# Patient Record
Sex: Male | Born: 1940 | Race: White | Hispanic: No | Marital: Married | Smoking: Never smoker
Health system: Southern US, Community
[De-identification: ages and names within clinical notes are randomized; demographics above are authoritative.]

## PROBLEM LIST (undated history)

## (undated) DIAGNOSIS — E119 Type 2 diabetes mellitus without complications: Secondary | ICD-10-CM

---

## 2014-05-07 DIAGNOSIS — Z79899 Other long term (current) drug therapy: Secondary | ICD-10-CM | POA: Diagnosis not present

## 2014-05-07 DIAGNOSIS — Z1211 Encounter for screening for malignant neoplasm of colon: Secondary | ICD-10-CM | POA: Diagnosis not present

## 2014-05-07 DIAGNOSIS — E780 Pure hypercholesterolemia: Secondary | ICD-10-CM | POA: Diagnosis not present

## 2014-05-07 DIAGNOSIS — Z0000 Encounter for general adult medical examination without abnormal findings: Secondary | ICD-10-CM | POA: Diagnosis not present

## 2014-05-07 DIAGNOSIS — H9191 Unspecified hearing loss, right ear: Secondary | ICD-10-CM | POA: Diagnosis not present

## 2014-05-07 DIAGNOSIS — E119 Type 2 diabetes mellitus without complications: Secondary | ICD-10-CM | POA: Diagnosis not present

## 2014-05-07 DIAGNOSIS — D696 Thrombocytopenia, unspecified: Secondary | ICD-10-CM | POA: Diagnosis not present

## 2014-09-11 DIAGNOSIS — G479 Sleep disorder, unspecified: Secondary | ICD-10-CM | POA: Diagnosis not present

## 2014-09-11 DIAGNOSIS — Z23 Encounter for immunization: Secondary | ICD-10-CM | POA: Diagnosis not present

## 2014-09-11 DIAGNOSIS — E780 Pure hypercholesterolemia: Secondary | ICD-10-CM | POA: Diagnosis not present

## 2014-09-11 DIAGNOSIS — D696 Thrombocytopenia, unspecified: Secondary | ICD-10-CM | POA: Diagnosis not present

## 2014-09-11 DIAGNOSIS — E119 Type 2 diabetes mellitus without complications: Secondary | ICD-10-CM | POA: Diagnosis not present

## 2015-01-09 DIAGNOSIS — Z23 Encounter for immunization: Secondary | ICD-10-CM | POA: Diagnosis not present

## 2015-01-09 DIAGNOSIS — E780 Pure hypercholesterolemia: Secondary | ICD-10-CM | POA: Diagnosis not present

## 2015-01-09 DIAGNOSIS — G479 Sleep disorder, unspecified: Secondary | ICD-10-CM | POA: Diagnosis not present

## 2015-01-09 DIAGNOSIS — H269 Unspecified cataract: Secondary | ICD-10-CM | POA: Diagnosis not present

## 2015-01-09 DIAGNOSIS — D696 Thrombocytopenia, unspecified: Secondary | ICD-10-CM | POA: Diagnosis not present

## 2015-01-09 DIAGNOSIS — E119 Type 2 diabetes mellitus without complications: Secondary | ICD-10-CM | POA: Diagnosis not present

## 2015-05-09 DIAGNOSIS — H6980 Other specified disorders of Eustachian tube, unspecified ear: Secondary | ICD-10-CM | POA: Diagnosis not present

## 2015-05-09 DIAGNOSIS — G5600 Carpal tunnel syndrome, unspecified upper limb: Secondary | ICD-10-CM | POA: Diagnosis not present

## 2015-05-09 DIAGNOSIS — E7800 Pure hypercholesterolemia, unspecified: Secondary | ICD-10-CM | POA: Diagnosis not present

## 2015-05-09 DIAGNOSIS — R51 Headache: Secondary | ICD-10-CM | POA: Diagnosis not present

## 2015-05-09 DIAGNOSIS — D696 Thrombocytopenia, unspecified: Secondary | ICD-10-CM | POA: Diagnosis not present

## 2015-05-09 DIAGNOSIS — Z125 Encounter for screening for malignant neoplasm of prostate: Secondary | ICD-10-CM | POA: Diagnosis not present

## 2015-05-09 DIAGNOSIS — Z1211 Encounter for screening for malignant neoplasm of colon: Secondary | ICD-10-CM | POA: Diagnosis not present

## 2015-05-09 DIAGNOSIS — Z0000 Encounter for general adult medical examination without abnormal findings: Secondary | ICD-10-CM | POA: Diagnosis not present

## 2015-05-09 DIAGNOSIS — H269 Unspecified cataract: Secondary | ICD-10-CM | POA: Diagnosis not present

## 2015-05-09 DIAGNOSIS — E119 Type 2 diabetes mellitus without complications: Secondary | ICD-10-CM | POA: Diagnosis not present

## 2015-09-11 DIAGNOSIS — Z862 Personal history of diseases of the blood and blood-forming organs and certain disorders involving the immune mechanism: Secondary | ICD-10-CM | POA: Diagnosis not present

## 2015-09-11 DIAGNOSIS — E119 Type 2 diabetes mellitus without complications: Secondary | ICD-10-CM | POA: Diagnosis not present

## 2015-09-11 DIAGNOSIS — E7800 Pure hypercholesterolemia, unspecified: Secondary | ICD-10-CM | POA: Diagnosis not present

## 2015-09-11 DIAGNOSIS — H269 Unspecified cataract: Secondary | ICD-10-CM | POA: Diagnosis not present

## 2015-09-16 DIAGNOSIS — H2513 Age-related nuclear cataract, bilateral: Secondary | ICD-10-CM | POA: Diagnosis not present

## 2015-09-16 DIAGNOSIS — H5203 Hypermetropia, bilateral: Secondary | ICD-10-CM | POA: Diagnosis not present

## 2015-09-16 DIAGNOSIS — E119 Type 2 diabetes mellitus without complications: Secondary | ICD-10-CM | POA: Diagnosis not present

## 2015-09-16 DIAGNOSIS — Z135 Encounter for screening for eye and ear disorders: Secondary | ICD-10-CM | POA: Diagnosis not present

## 2016-01-14 DIAGNOSIS — H269 Unspecified cataract: Secondary | ICD-10-CM | POA: Diagnosis not present

## 2016-01-14 DIAGNOSIS — Z23 Encounter for immunization: Secondary | ICD-10-CM | POA: Diagnosis not present

## 2016-01-14 DIAGNOSIS — E119 Type 2 diabetes mellitus without complications: Secondary | ICD-10-CM | POA: Diagnosis not present

## 2016-01-14 DIAGNOSIS — Z862 Personal history of diseases of the blood and blood-forming organs and certain disorders involving the immune mechanism: Secondary | ICD-10-CM | POA: Diagnosis not present

## 2016-01-14 DIAGNOSIS — E7800 Pure hypercholesterolemia, unspecified: Secondary | ICD-10-CM | POA: Diagnosis not present

## 2016-05-26 DIAGNOSIS — Z862 Personal history of diseases of the blood and blood-forming organs and certain disorders involving the immune mechanism: Secondary | ICD-10-CM | POA: Diagnosis not present

## 2016-05-26 DIAGNOSIS — E119 Type 2 diabetes mellitus without complications: Secondary | ICD-10-CM | POA: Diagnosis not present

## 2016-05-26 DIAGNOSIS — R221 Localized swelling, mass and lump, neck: Secondary | ICD-10-CM | POA: Diagnosis not present

## 2016-05-26 DIAGNOSIS — E7800 Pure hypercholesterolemia, unspecified: Secondary | ICD-10-CM | POA: Diagnosis not present

## 2016-05-26 DIAGNOSIS — Z125 Encounter for screening for malignant neoplasm of prostate: Secondary | ICD-10-CM | POA: Diagnosis not present

## 2016-05-26 DIAGNOSIS — Z0000 Encounter for general adult medical examination without abnormal findings: Secondary | ICD-10-CM | POA: Diagnosis not present

## 2016-05-27 DIAGNOSIS — Z1211 Encounter for screening for malignant neoplasm of colon: Secondary | ICD-10-CM | POA: Diagnosis not present

## 2016-05-27 DIAGNOSIS — Z79899 Other long term (current) drug therapy: Secondary | ICD-10-CM | POA: Diagnosis not present

## 2016-05-28 ENCOUNTER — Other Ambulatory Visit: Payer: Self-pay | Admitting: Unknown Physician Specialty

## 2016-05-28 DIAGNOSIS — R221 Localized swelling, mass and lump, neck: Secondary | ICD-10-CM

## 2016-06-01 ENCOUNTER — Ambulatory Visit (INDEPENDENT_AMBULATORY_CARE_PROVIDER_SITE_OTHER): Payer: Medicare Other

## 2016-06-01 DIAGNOSIS — R221 Localized swelling, mass and lump, neck: Secondary | ICD-10-CM | POA: Diagnosis not present

## 2016-06-01 DIAGNOSIS — E041 Nontoxic single thyroid nodule: Secondary | ICD-10-CM | POA: Diagnosis not present

## 2016-09-21 DIAGNOSIS — E119 Type 2 diabetes mellitus without complications: Secondary | ICD-10-CM | POA: Diagnosis not present

## 2016-09-21 DIAGNOSIS — H2513 Age-related nuclear cataract, bilateral: Secondary | ICD-10-CM | POA: Diagnosis not present

## 2016-09-21 DIAGNOSIS — Z135 Encounter for screening for eye and ear disorders: Secondary | ICD-10-CM | POA: Diagnosis not present

## 2016-09-21 DIAGNOSIS — H5203 Hypermetropia, bilateral: Secondary | ICD-10-CM | POA: Diagnosis not present

## 2016-09-26 ENCOUNTER — Emergency Department
Admission: EM | Admit: 2016-09-26 | Discharge: 2016-09-26 | Disposition: A | Discharge status: Home / Self Care | Payer: Medicare Other | Source: Home / Self Care | Attending: Family Medicine | Admitting: Family Medicine

## 2016-09-26 DIAGNOSIS — W5503XA Scratched by cat, initial encounter: Secondary | ICD-10-CM

## 2016-09-26 DIAGNOSIS — L03116 Cellulitis of left lower limb: Secondary | ICD-10-CM

## 2016-09-26 DIAGNOSIS — M79662 Pain in left lower leg: Secondary | ICD-10-CM

## 2016-09-26 DIAGNOSIS — S80812A Abrasion, left lower leg, initial encounter: Secondary | ICD-10-CM

## 2016-09-26 HISTORY — DX: Type 2 diabetes mellitus without complications: E11.9

## 2016-09-26 MED ORDER — DOXYCYCLINE HYCLATE 100 MG PO CAPS: 100.0000 mg | ORAL_CAPSULE | Freq: Two times a day (BID) | ORAL | 0 refills | Status: AC

## 2016-09-26 NOTE — ED Provider Notes (Signed)
[facility]    CSN: [id number] Arrival date & time: 09/26/16  1136     History   Chief Complaint Chief Complaint  Patient presents with  . Animal Bite    Scratch, not bite     HPI Thomas Castaneda is a 76 y.o. male.   Patient reports that he was scratched on his left ankle by a stray cat that he has been feeding four days ago.  He had no pain/swelling initially, but the area became more swollen, erythematous and painful two days ago.  No fevers, chills, and sweats.  No calf pain. He reports that the stray cat has not appeared ill, and is still present around his house.  He believes that his Tdap is current.   The history is provided by the patient and the spouse.    Past Medical History:  Diagnosis Date  . Diabetes mellitus without complication (HCC)     There are no active problems to display for this patient.   History reviewed. No pertinent surgical history.     Home Medications    Prior to Admission medications   Medication Sig Start Date End Date Taking? Authorizing Provider  aspirin 81 MG tablet Take 81 mg by mouth daily.   Yes [provider]  GLIPIZIDE PO Take by mouth daily.   Yes [provider]  metFORMIN (GLUCOPHAGE) 500 MG tablet Take by mouth 2 (two) times daily with a meal.   Yes [provider]  doxycycline (VIBRAMYCIN) 100 MG capsule Take 1 capsule (100 mg total) by mouth 2 (two) times daily. Take with food. 09/26/16   Lattie Haw, MD    Family History No family history on file.  Social History Social History  Substance Use Topics  . Smoking status: Never Smoker  . Smokeless tobacco: Never Used  . Alcohol use No     Allergies   Patient has no known allergies.   Review of Systems Review of Systems  Constitutional: Negative for activity change, appetite change, chills, diaphoresis, fatigue and fever.  HENT: Negative.   Eyes: Negative.   Respiratory: Negative.   Cardiovascular: Negative.    Gastrointestinal: Negative.   Genitourinary: Negative.   Musculoskeletal: Negative for arthralgias, joint swelling and myalgias.  Skin: Positive for color change and wound. Negative for rash.  Neurological: Negative for dizziness and headaches.     Physical Exam Triage Vital Signs ED Triage Vitals [09/26/16 1205]  Enc Vitals Group     BP 126/80     Pulse Rate 87     Resp      Temp 98.4 F (36.9 C)     Temp Source Oral     SpO2 98 %     Weight 199 lb (90.3 kg)     Height      Head Circumference      Peak Flow      Pain Score      Pain Loc      Pain Edu?      Excl. in GC?    No data found.   Updated Vital Signs BP 126/80 (BP Location: Left Arm)   Pulse 87   Temp 98.4 F (36.9 C) (Oral)   Wt 199 lb (90.3 kg)   SpO2 98%   Visual Acuity Right Eye Distance:   Left Eye Distance:   Bilateral Distance:    Right Eye Near:   Left Eye Near:    Bilateral Near:     Physical Exam  Constitutional: He appears well-nourished. No distress.  HENT:  Head: Normocephalic.  Right Ear: External ear normal.  Left Ear: External ear normal.  Nose: Nose normal.  Mouth/Throat: Oropharynx is clear and moist.  Eyes: Conjunctivae are normal. Pupils are equal, round, and reactive to light.  Neck: Neck supple.  Cardiovascular: Normal heart sounds.   Pulmonary/Chest: Breath sounds normal.  Musculoskeletal: He exhibits tenderness. He exhibits no edema.       Left lower leg: He exhibits tenderness and swelling.       Legs: Left lower leg just above ankle is erythematous, mildly swollen, and tender to palpation with a weeping abrasion present.  Ankle has full range of motion.  No posterior calf tenderness present.  Lymphadenopathy:    He has no cervical adenopathy.  Neurological: He is alert.  Skin: Skin is warm and dry.  Nursing note and vitals reviewed.    UC Treatments / Results  Labs (all labs ordered are listed, but only abnormal results are displayed) Labs Reviewed - No  data to display  EKG  EKG Interpretation None       Radiology No results found.  Procedures Procedures (including critical care time)  Medications Ordered in UC Medications - No data to display   Initial Impression / Assessment and Plan / UC Course  I have reviewed the triage vital signs and the nursing notes.  Pertinent labs & imaging results that were available during my care of the patient were reviewed by me and considered in my medical decision making (see chart for details).    Animal control notified to capture cat for observation. Wound culture pending. Begin doxycycline 100mg  BID for staph coverage. Elevate leg as much as possible. Change dressing daily and apply Bacitracin ointment to wound.  Keep wound clean and dry.   If symptoms become significantly worse during the night or over the weekend, proceed to the local emergency room.  Followup with Family Doctor if not improved in about 3 days.    Final Clinical Impressions(s) / UC Diagnoses   Final diagnoses:  Cat scratch  Cellulitis of left ankle    New Prescriptions New Prescriptions   DOXYCYCLINE (VIBRAMYCIN) 100 MG CAPSULE    Take 1 capsule (100 mg total) by mouth 2 (two) times daily. Take with food.     Lattie HawBeese, Maysa Lynn A, MD 09/27/16 [phone]

## 2016-09-26 NOTE — ED Triage Notes (Signed)
Patient states was feeding cats and a stray grab his ankle and scratched him. This happened on Wednesday 09/22/16. He states only started getting red and bothering him on Friday. Has used Neosporin cream and does seem to help.

## 2016-09-26 NOTE — Discharge Instructions (Signed)
Elevate leg as much as possible. Change dressing daily and apply Bacitracin ointment to wound.  Keep wound clean and dry.   If symptoms become significantly worse during the night or over the weekend, proceed to the local emergency room.

## 2016-09-29 ENCOUNTER — Telehealth: Payer: Self-pay | Admitting: Emergency Medicine

## 2016-09-29 LAB — WOUND CULTURE
GRAM STAIN: NONE SEEN
Gram Stain: NONE SEEN
Organism ID, Bacteria: NORMAL

## 2016-09-29 NOTE — Telephone Encounter (Signed)
Wound culture came back negative, no bacteria found, hope you are improving

## 2016-10-15 DIAGNOSIS — E119 Type 2 diabetes mellitus without complications: Secondary | ICD-10-CM | POA: Diagnosis not present

## 2016-10-15 DIAGNOSIS — E7800 Pure hypercholesterolemia, unspecified: Secondary | ICD-10-CM | POA: Diagnosis not present

## 2016-10-15 DIAGNOSIS — Z862 Personal history of diseases of the blood and blood-forming organs and certain disorders involving the immune mechanism: Secondary | ICD-10-CM | POA: Diagnosis not present

## 2016-10-15 DIAGNOSIS — Z23 Encounter for immunization: Secondary | ICD-10-CM | POA: Diagnosis not present

## 2016-10-15 DIAGNOSIS — H269 Unspecified cataract: Secondary | ICD-10-CM | POA: Diagnosis not present

## 2016-12-06 DIAGNOSIS — H43811 Vitreous degeneration, right eye: Secondary | ICD-10-CM | POA: Diagnosis not present

## 2016-12-06 DIAGNOSIS — H02403 Unspecified ptosis of bilateral eyelids: Secondary | ICD-10-CM | POA: Diagnosis not present

## 2016-12-06 DIAGNOSIS — H52203 Unspecified astigmatism, bilateral: Secondary | ICD-10-CM | POA: Diagnosis not present

## 2016-12-06 DIAGNOSIS — H527 Unspecified disorder of refraction: Secondary | ICD-10-CM | POA: Diagnosis not present

## 2016-12-06 DIAGNOSIS — H25813 Combined forms of age-related cataract, bilateral: Secondary | ICD-10-CM | POA: Diagnosis not present

## 2016-12-09 DIAGNOSIS — H52203 Unspecified astigmatism, bilateral: Secondary | ICD-10-CM | POA: Diagnosis not present

## 2016-12-09 DIAGNOSIS — H25813 Combined forms of age-related cataract, bilateral: Secondary | ICD-10-CM | POA: Diagnosis not present

## 2016-12-14 DIAGNOSIS — E119 Type 2 diabetes mellitus without complications: Secondary | ICD-10-CM | POA: Diagnosis not present

## 2016-12-14 DIAGNOSIS — Z7982 Long term (current) use of aspirin: Secondary | ICD-10-CM | POA: Diagnosis not present

## 2016-12-14 DIAGNOSIS — Z79899 Other long term (current) drug therapy: Secondary | ICD-10-CM | POA: Diagnosis not present

## 2016-12-14 DIAGNOSIS — H25811 Combined forms of age-related cataract, right eye: Secondary | ICD-10-CM | POA: Diagnosis not present

## 2016-12-15 DIAGNOSIS — Z961 Presence of intraocular lens: Secondary | ICD-10-CM | POA: Diagnosis not present

## 2016-12-15 DIAGNOSIS — H2511 Age-related nuclear cataract, right eye: Secondary | ICD-10-CM | POA: Diagnosis not present

## 2016-12-31 DIAGNOSIS — Z23 Encounter for immunization: Secondary | ICD-10-CM | POA: Diagnosis not present

## 2017-01-04 DIAGNOSIS — Z7984 Long term (current) use of oral hypoglycemic drugs: Secondary | ICD-10-CM | POA: Diagnosis not present

## 2017-01-04 DIAGNOSIS — Z7982 Long term (current) use of aspirin: Secondary | ICD-10-CM | POA: Diagnosis not present

## 2017-01-04 DIAGNOSIS — E119 Type 2 diabetes mellitus without complications: Secondary | ICD-10-CM | POA: Diagnosis not present

## 2017-01-04 DIAGNOSIS — H25812 Combined forms of age-related cataract, left eye: Secondary | ICD-10-CM | POA: Diagnosis not present

## 2017-01-05 DIAGNOSIS — Z961 Presence of intraocular lens: Secondary | ICD-10-CM | POA: Diagnosis not present

## 2017-01-05 DIAGNOSIS — H2512 Age-related nuclear cataract, left eye: Secondary | ICD-10-CM | POA: Diagnosis not present

## 2017-02-10 DIAGNOSIS — Z961 Presence of intraocular lens: Secondary | ICD-10-CM | POA: Diagnosis not present

## 2017-02-15 DIAGNOSIS — E1165 Type 2 diabetes mellitus with hyperglycemia: Secondary | ICD-10-CM | POA: Diagnosis not present

## 2017-02-15 DIAGNOSIS — E7800 Pure hypercholesterolemia, unspecified: Secondary | ICD-10-CM | POA: Diagnosis not present

## 2017-06-03 DIAGNOSIS — R221 Localized swelling, mass and lump, neck: Secondary | ICD-10-CM | POA: Diagnosis not present

## 2017-06-03 DIAGNOSIS — Z862 Personal history of diseases of the blood and blood-forming organs and certain disorders involving the immune mechanism: Secondary | ICD-10-CM | POA: Diagnosis not present

## 2017-06-03 DIAGNOSIS — R5383 Other fatigue: Secondary | ICD-10-CM | POA: Diagnosis not present

## 2017-06-03 DIAGNOSIS — Z0000 Encounter for general adult medical examination without abnormal findings: Secondary | ICD-10-CM | POA: Diagnosis not present

## 2017-06-03 DIAGNOSIS — E119 Type 2 diabetes mellitus without complications: Secondary | ICD-10-CM | POA: Diagnosis not present

## 2017-06-03 DIAGNOSIS — E7800 Pure hypercholesterolemia, unspecified: Secondary | ICD-10-CM | POA: Diagnosis not present

## 2017-06-03 DIAGNOSIS — Z125 Encounter for screening for malignant neoplasm of prostate: Secondary | ICD-10-CM | POA: Diagnosis not present

## 2017-09-29 DIAGNOSIS — E119 Type 2 diabetes mellitus without complications: Secondary | ICD-10-CM | POA: Diagnosis not present

## 2017-09-29 DIAGNOSIS — Z8639 Personal history of other endocrine, nutritional and metabolic disease: Secondary | ICD-10-CM | POA: Diagnosis not present

## 2017-09-29 DIAGNOSIS — K649 Unspecified hemorrhoids: Secondary | ICD-10-CM | POA: Diagnosis not present

## 2017-09-29 DIAGNOSIS — E1165 Type 2 diabetes mellitus with hyperglycemia: Secondary | ICD-10-CM | POA: Diagnosis not present

## 2018-01-11 DIAGNOSIS — Z23 Encounter for immunization: Secondary | ICD-10-CM | POA: Diagnosis not present

## 2018-01-30 DIAGNOSIS — E1165 Type 2 diabetes mellitus with hyperglycemia: Secondary | ICD-10-CM | POA: Diagnosis not present

## 2018-01-30 DIAGNOSIS — Z862 Personal history of diseases of the blood and blood-forming organs and certain disorders involving the immune mechanism: Secondary | ICD-10-CM | POA: Diagnosis not present

## 2018-01-30 DIAGNOSIS — M47816 Spondylosis without myelopathy or radiculopathy, lumbar region: Secondary | ICD-10-CM | POA: Diagnosis not present

## 2018-01-30 DIAGNOSIS — Z8639 Personal history of other endocrine, nutritional and metabolic disease: Secondary | ICD-10-CM | POA: Diagnosis not present

## 2018-03-13 DIAGNOSIS — R7309 Other abnormal glucose: Secondary | ICD-10-CM | POA: Diagnosis not present

## 2018-06-05 DIAGNOSIS — Z8639 Personal history of other endocrine, nutritional and metabolic disease: Secondary | ICD-10-CM | POA: Diagnosis not present

## 2018-06-05 DIAGNOSIS — E1165 Type 2 diabetes mellitus with hyperglycemia: Secondary | ICD-10-CM | POA: Diagnosis not present

## 2018-06-05 DIAGNOSIS — R5383 Other fatigue: Secondary | ICD-10-CM | POA: Diagnosis not present

## 2018-06-05 DIAGNOSIS — R221 Localized swelling, mass and lump, neck: Secondary | ICD-10-CM | POA: Diagnosis not present

## 2018-06-05 DIAGNOSIS — Z0000 Encounter for general adult medical examination without abnormal findings: Secondary | ICD-10-CM | POA: Diagnosis not present

## 2018-06-05 DIAGNOSIS — Z862 Personal history of diseases of the blood and blood-forming organs and certain disorders involving the immune mechanism: Secondary | ICD-10-CM | POA: Diagnosis not present

## 2018-06-24 IMAGING — US US SOFT TISSUE HEAD/NECK
1 series · 14 of 17 positions shown · non-contrast
Comparison: None available

CLINICAL DATA: Palpable left lateral neck nodule.

EXAM:
ULTRASOUND OF HEAD/NECK SOFT TISSUES
TECHNIQUE: Ultrasound examination of the head and neck soft tissues was
performed in the area of clinical concern.

[Series 1: us soft tissue head/neck · 0.07mm/px · 14 of 17 slices shown]
[im 1/17]
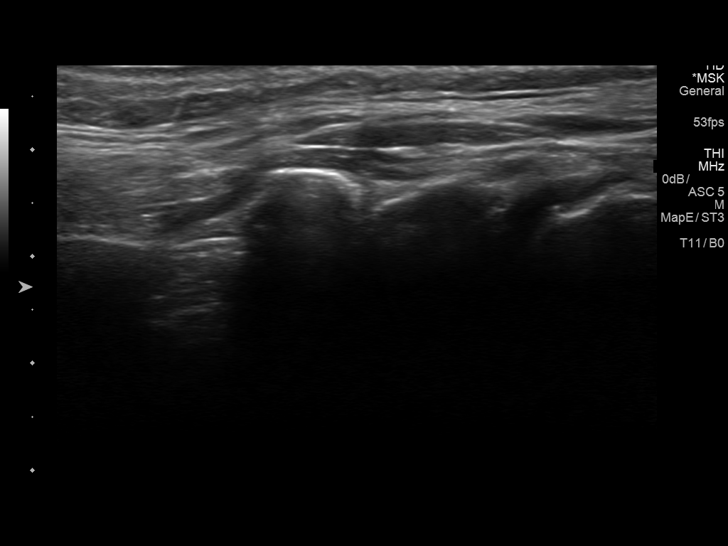
[im 2/17]
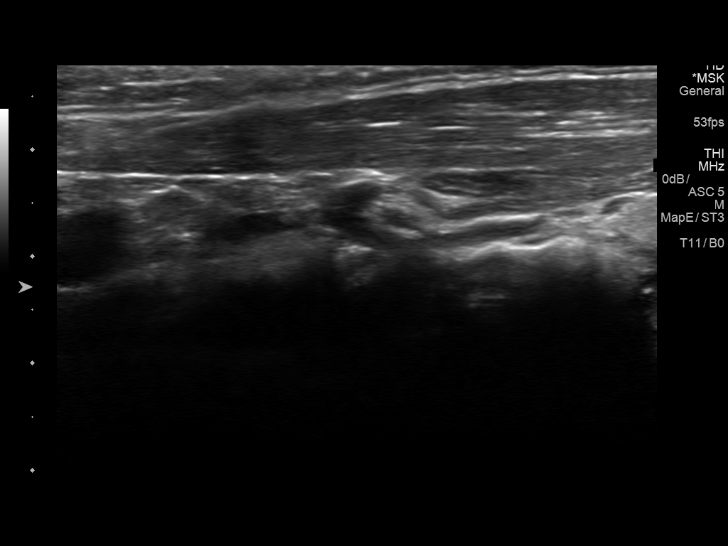
[im 4/17]
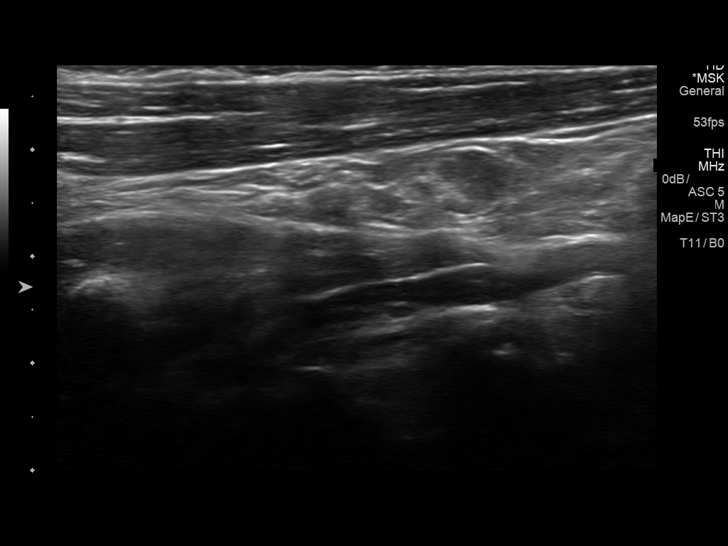
[im 5/17]
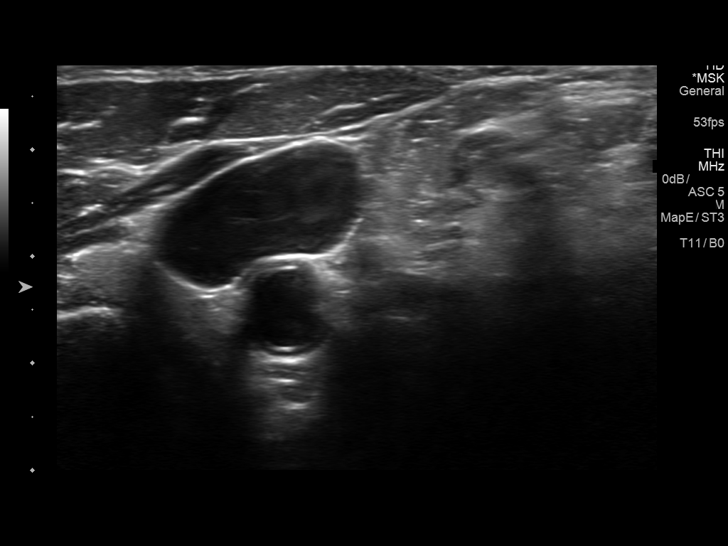
[im 6/17]
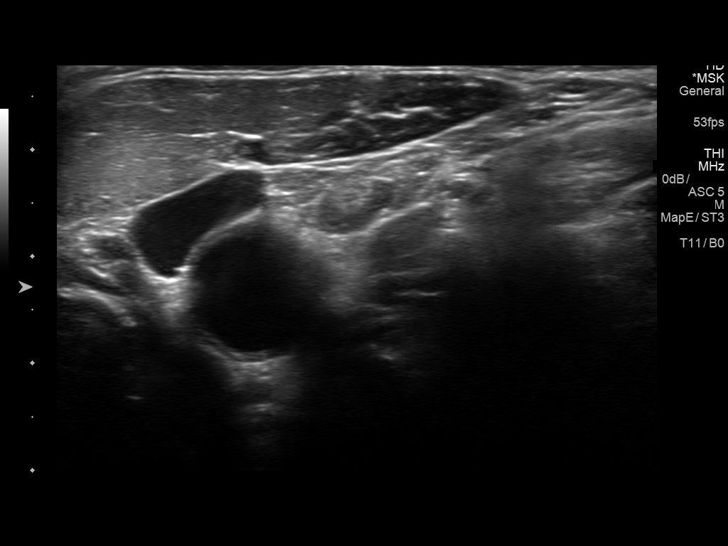
[im 7/17]
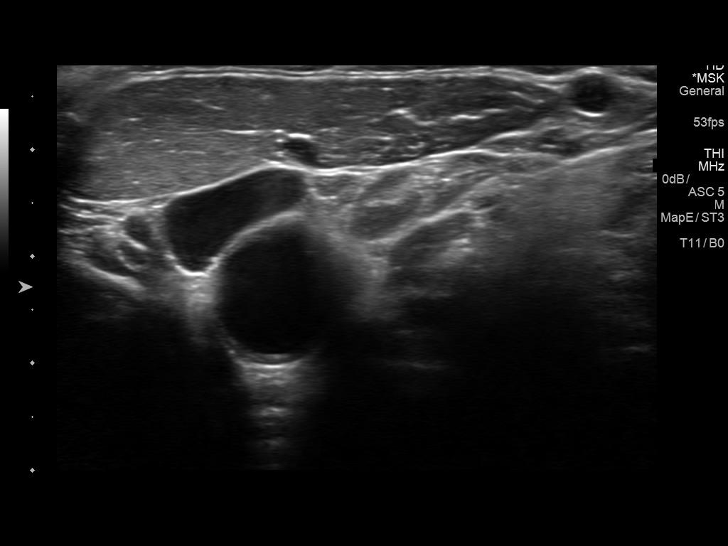
[im 8/17]
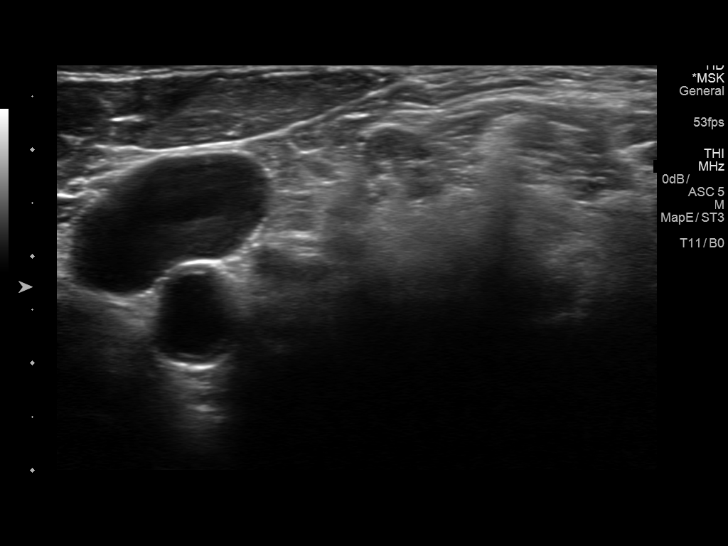
[im 10/17]
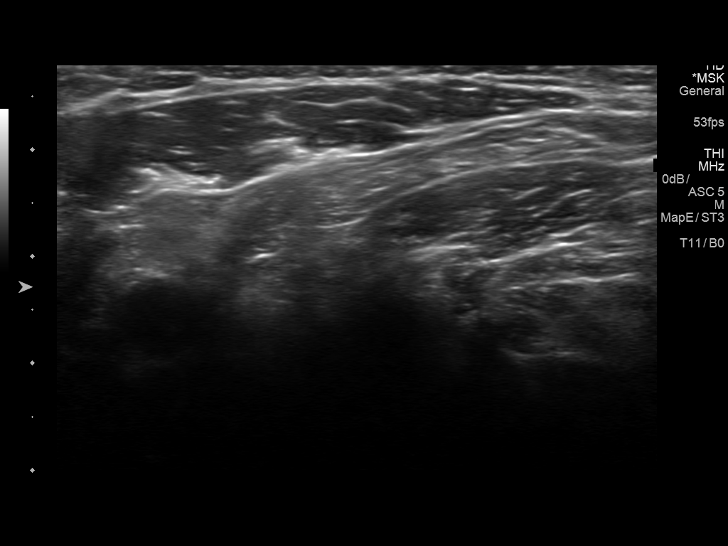
[im 11/17]
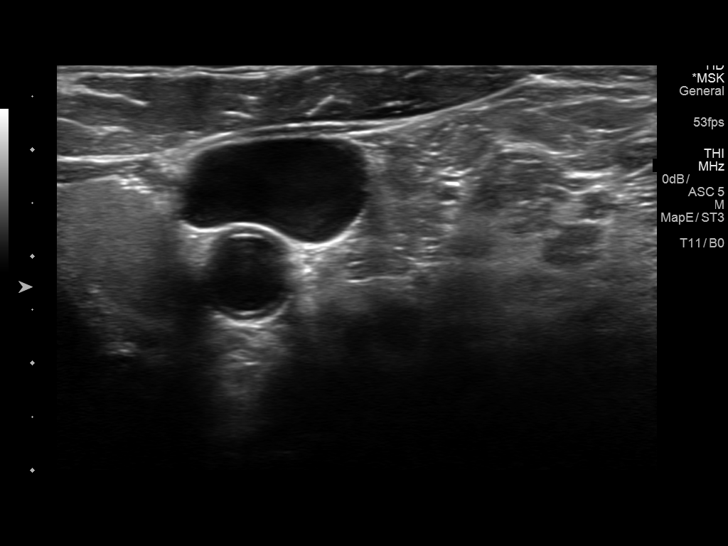
[im 12/17]
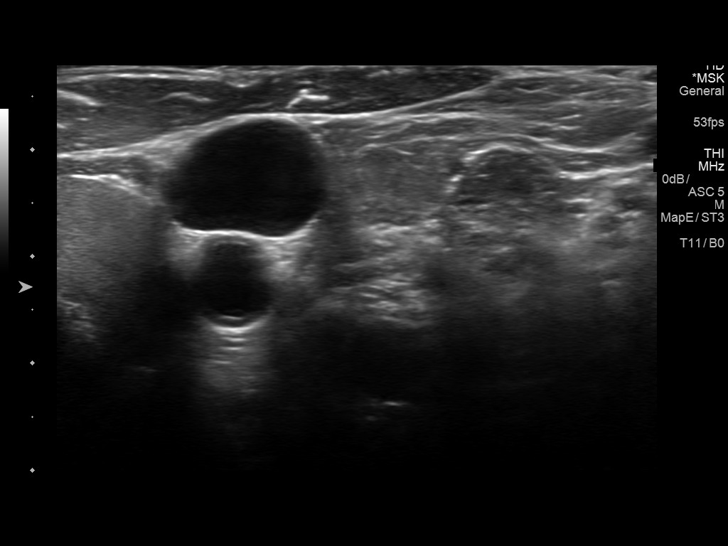
[im 13/17]
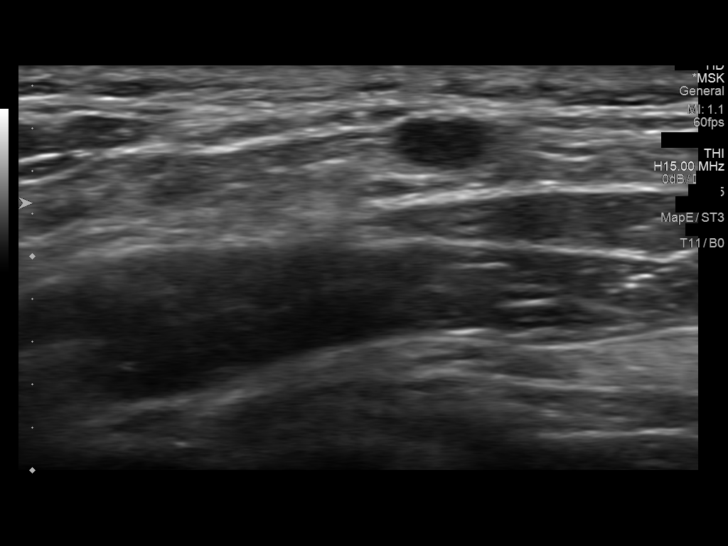
[im 14/17]
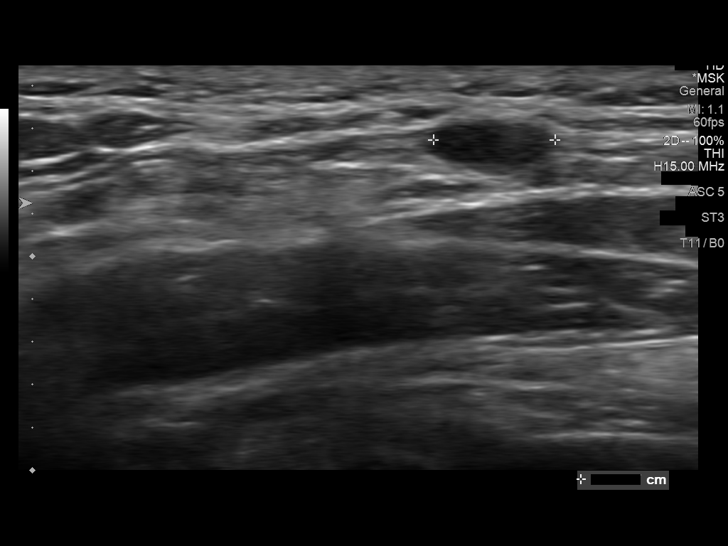
[im 16/17]
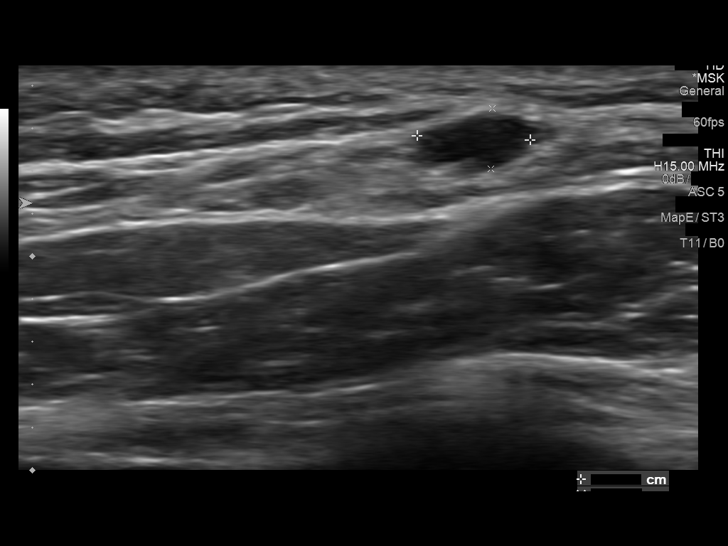
[im 17/17]
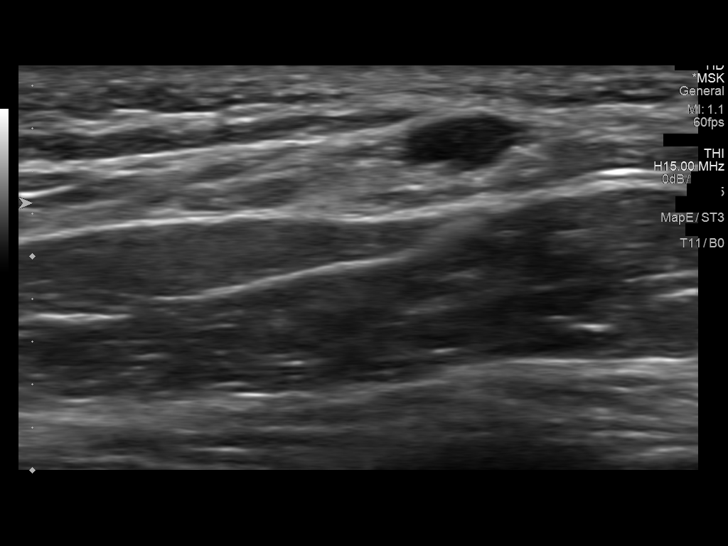

[14 of 17 positions shown; findings below may reference images not displayed]

FINDINGS: Superficial soft tissue ultrasound performed of the left lateral
neck along the ear lobe at the palpable small abnormality. This
correlates with a small superficial subcutaneous hypoechoic round
structure measuring 6 x 5 x 3 mm. This is most compatible with a
small benign lymph node. No other significant soft tissue
abnormality.
IMPRESSION: Small superficial left lateral neck 6 mm benign lymph node appears
to correlate with the small palpable abnormality.

## 2018-10-12 DIAGNOSIS — E7800 Pure hypercholesterolemia, unspecified: Secondary | ICD-10-CM | POA: Diagnosis not present

## 2018-10-12 DIAGNOSIS — E1165 Type 2 diabetes mellitus with hyperglycemia: Secondary | ICD-10-CM | POA: Diagnosis not present

## 2018-10-12 DIAGNOSIS — M47816 Spondylosis without myelopathy or radiculopathy, lumbar region: Secondary | ICD-10-CM | POA: Diagnosis not present

## 2018-12-28 DIAGNOSIS — Z23 Encounter for immunization: Secondary | ICD-10-CM | POA: Diagnosis not present

## 2019-05-22 DIAGNOSIS — R5383 Other fatigue: Secondary | ICD-10-CM | POA: Diagnosis not present

## 2019-05-22 DIAGNOSIS — M47816 Spondylosis without myelopathy or radiculopathy, lumbar region: Secondary | ICD-10-CM | POA: Diagnosis not present

## 2019-05-22 DIAGNOSIS — Z0000 Encounter for general adult medical examination without abnormal findings: Secondary | ICD-10-CM | POA: Diagnosis not present

## 2019-05-22 DIAGNOSIS — Z862 Personal history of diseases of the blood and blood-forming organs and certain disorders involving the immune mechanism: Secondary | ICD-10-CM | POA: Diagnosis not present

## 2019-05-22 DIAGNOSIS — E7800 Pure hypercholesterolemia, unspecified: Secondary | ICD-10-CM | POA: Diagnosis not present

## 2019-05-22 DIAGNOSIS — E1165 Type 2 diabetes mellitus with hyperglycemia: Secondary | ICD-10-CM | POA: Diagnosis not present

## 2019-05-25 DIAGNOSIS — Z1211 Encounter for screening for malignant neoplasm of colon: Secondary | ICD-10-CM | POA: Diagnosis not present

## 2019-07-12 DIAGNOSIS — E118 Type 2 diabetes mellitus with unspecified complications: Secondary | ICD-10-CM | POA: Diagnosis not present

## 2019-07-12 DIAGNOSIS — E1165 Type 2 diabetes mellitus with hyperglycemia: Secondary | ICD-10-CM | POA: Diagnosis not present

## 2019-10-11 DIAGNOSIS — E1165 Type 2 diabetes mellitus with hyperglycemia: Secondary | ICD-10-CM | POA: Diagnosis not present

## 2019-10-11 DIAGNOSIS — E118 Type 2 diabetes mellitus with unspecified complications: Secondary | ICD-10-CM | POA: Diagnosis not present

## 2019-11-19 DIAGNOSIS — E7800 Pure hypercholesterolemia, unspecified: Secondary | ICD-10-CM | POA: Diagnosis not present

## 2019-11-19 DIAGNOSIS — M47816 Spondylosis without myelopathy or radiculopathy, lumbar region: Secondary | ICD-10-CM | POA: Diagnosis not present

## 2019-11-19 DIAGNOSIS — E1165 Type 2 diabetes mellitus with hyperglycemia: Secondary | ICD-10-CM | POA: Diagnosis not present

## 2020-01-10 DIAGNOSIS — Z23 Encounter for immunization: Secondary | ICD-10-CM | POA: Diagnosis not present

## 2020-01-17 DIAGNOSIS — E1165 Type 2 diabetes mellitus with hyperglycemia: Secondary | ICD-10-CM | POA: Diagnosis not present

## 2020-01-17 DIAGNOSIS — E118 Type 2 diabetes mellitus with unspecified complications: Secondary | ICD-10-CM | POA: Diagnosis not present

## 2020-04-17 DIAGNOSIS — E118 Type 2 diabetes mellitus with unspecified complications: Secondary | ICD-10-CM | POA: Diagnosis not present

## 2020-04-17 DIAGNOSIS — E1165 Type 2 diabetes mellitus with hyperglycemia: Secondary | ICD-10-CM | POA: Diagnosis not present

## 2020-05-21 DIAGNOSIS — M47816 Spondylosis without myelopathy or radiculopathy, lumbar region: Secondary | ICD-10-CM | POA: Diagnosis not present

## 2020-05-21 DIAGNOSIS — R5383 Other fatigue: Secondary | ICD-10-CM | POA: Diagnosis not present

## 2020-05-21 DIAGNOSIS — Z862 Personal history of diseases of the blood and blood-forming organs and certain disorders involving the immune mechanism: Secondary | ICD-10-CM | POA: Diagnosis not present

## 2020-05-21 DIAGNOSIS — Z0000 Encounter for general adult medical examination without abnormal findings: Secondary | ICD-10-CM | POA: Diagnosis not present

## 2020-05-21 DIAGNOSIS — E7800 Pure hypercholesterolemia, unspecified: Secondary | ICD-10-CM | POA: Diagnosis not present

## 2020-05-22 DIAGNOSIS — Z7689 Persons encountering health services in other specified circumstances: Secondary | ICD-10-CM | POA: Diagnosis not present

## 2020-10-16 DIAGNOSIS — E1165 Type 2 diabetes mellitus with hyperglycemia: Secondary | ICD-10-CM | POA: Diagnosis not present

## 2020-10-16 DIAGNOSIS — E118 Type 2 diabetes mellitus with unspecified complications: Secondary | ICD-10-CM | POA: Diagnosis not present

## 2020-11-17 DIAGNOSIS — M47816 Spondylosis without myelopathy or radiculopathy, lumbar region: Secondary | ICD-10-CM | POA: Diagnosis not present

## 2020-11-17 DIAGNOSIS — Z862 Personal history of diseases of the blood and blood-forming organs and certain disorders involving the immune mechanism: Secondary | ICD-10-CM | POA: Diagnosis not present

## 2020-11-17 DIAGNOSIS — E7800 Pure hypercholesterolemia, unspecified: Secondary | ICD-10-CM | POA: Diagnosis not present

## 2020-11-17 DIAGNOSIS — E1169 Type 2 diabetes mellitus with other specified complication: Secondary | ICD-10-CM | POA: Diagnosis not present

## 2021-04-17 DIAGNOSIS — M2042 Other hammer toe(s) (acquired), left foot: Secondary | ICD-10-CM | POA: Diagnosis not present

## 2021-04-17 DIAGNOSIS — E1165 Type 2 diabetes mellitus with hyperglycemia: Secondary | ICD-10-CM | POA: Diagnosis not present

## 2021-04-17 DIAGNOSIS — M2041 Other hammer toe(s) (acquired), right foot: Secondary | ICD-10-CM | POA: Diagnosis not present
